# Patient Record
Sex: Male | Born: 1985 | Race: Black or African American | Hispanic: No | Smoking: Current every day smoker
Health system: Southern US, Community
[De-identification: ages and names within clinical notes are randomized; demographics above are authoritative.]

## PROBLEM LIST (undated history)

## (undated) DIAGNOSIS — R011 Cardiac murmur, unspecified: Secondary | ICD-10-CM

## (undated) DIAGNOSIS — T71162A Asphyxiation due to hanging, intentional self-harm, initial encounter: Secondary | ICD-10-CM

## (undated) DIAGNOSIS — T1491XA Suicide attempt, initial encounter: Secondary | ICD-10-CM

## (undated) DIAGNOSIS — Z8711 Personal history of peptic ulcer disease: Secondary | ICD-10-CM

## (undated) HISTORY — PX: ABDOMEN SURGERY: SHX537

## (undated) HISTORY — PX: ABDOMINAL SURGERY: SHX537

---
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
## 2012-05-24 ENCOUNTER — Emergency Department
Admission: EM | Admit: 2012-05-24 | Disposition: A | Discharge status: Home / Self Care | Payer: Self-pay | Source: Ambulatory Visit

## 2012-05-24 ENCOUNTER — Encounter: Payer: Self-pay | Admitting: Emergency Medicine

## 2012-05-24 DIAGNOSIS — K625 Hemorrhage of anus and rectum: Secondary | ICD-10-CM | POA: Diagnosis present

## 2012-05-24 HISTORY — DX: Personal history of peptic ulcer disease: Z87.11

## 2012-05-24 MED ORDER — OMEPRAZOLE 40 MG PO CPDR *I*
40.0000 mg | DELAYED_RELEASE_CAPSULE | Freq: Every day | ORAL | Status: AC
Start: 2012-05-24 — End: 2012-06-23

## 2012-05-24 NOTE — ED Notes (Signed)
Hx of ulcers. C/o black stools since yesterday and x1 emesis that was pink tinged

## 2012-05-24 NOTE — ED Notes (Cosign Needed)
Patient states he had blood in his stool three days ago.  No blood today.  He denies any abdominal pain, chest pain or any other problems.  He states his girlfriend talked to him yesterday and they decided to be seen here today.

## 2012-05-24 NOTE — Discharge Instructions (Signed)
You had a guaiac negative stool card in the ER.  You had no signs of extreme bleeding.    Omeprazole 40mg  daily.    Please get established with a Primary Care provider.  See list.    Please call the GI Clinic to have further testing likely a colonoscopy to determine why you are bleeding.    Return for worsening bleeding or other serious concerns.

## 2012-05-24 NOTE — ED Provider Notes (Signed)
History     Chief Complaint   Patient presents with   . Melena     HPI Comments: 26yo AAM presents with Rectal bleeding (BRBPR) several times this past week.  Pt states the blood was in the stool.  States the toilet water was not bloody.  Pt states he has a h/o gastric ulcers at age 26yo and took omeprazole for it.  Denies near syncope signs/symptoms or headaches.  Denies abd pain.      Past Medical History   Diagnosis Date   . History of stomach ulcers             History reviewed. No pertinent past surgical history.    Family History   Problem Relation Age of Onset   . Other Mother      Gastric ulcers.   . Other Paternal Grandmother      Paternal grandmother with gastric ulcers.         Social History      reports that he has been smoking Cigarettes.  He has a 2.5 pack-year smoking history. He has never used smokeless tobacco. He reports that he currently engages in sexual activity. He reports that he does not drink alcohol or use illicit drugs.    Living Situation    Questions Responses    Patient lives with Family    Homeless No    Caregiver for other family member No    External Services None    Employment Employed    Domestic Violence Risk           Review of Systems   Review of Systems   Constitutional: Negative for fever, chills, diaphoresis and fatigue.   HENT: Negative for sore throat.    Eyes: Negative for visual disturbance.   Respiratory: Negative for cough, choking, chest tightness, shortness of breath, wheezing and stridor.    Cardiovascular: Negative for chest pain, palpitations and leg swelling.   Gastrointestinal: Positive for blood in stool. Negative for nausea, vomiting and abdominal pain.   Genitourinary: Negative for dysuria, hematuria and difficulty urinating.   Skin: Negative for color change, pallor, rash and wound.   Neurological: Negative.    Hematological: Negative for adenopathy.   Psychiatric/Behavioral: Negative for behavioral problems, confusion and agitation.       Physical Exam      ED Triage Vitals   BP Heart Rate Heart Rate(via Pulse Ox) Resp Temp Temp src SpO2 O2 Device O2 Flow Rate   05/24/12 1726 05/24/12 1724 -- 05/24/12 1724 05/24/12 1724 -- 05/24/12 1724 05/24/12 1724 --   147/61 mmHg 80  18 36.9 C (98.4 F)  100 % None (Room air)       Weight           05/24/12 1724           81.647 kg (180 lb)               Physical Exam   Nursing note and vitals reviewed.  Constitutional: He is oriented to person, place, and time. He appears well-developed and well-nourished. No distress.   Neck: Normal range of motion. Neck supple. Normal carotid pulses and no JVD present. Carotid bruit is not present.   Cardiovascular: Normal rate, regular rhythm, normal heart sounds and intact distal pulses.  Exam reveals no gallop and no friction rub.    No murmur heard.  Pulmonary/Chest: Effort normal and breath sounds normal. No respiratory distress. He has no wheezes. He has no rales.  He exhibits no tenderness.   Abdominal: Soft. Bowel sounds are normal. He exhibits no distension and no mass. There is no tenderness. There is no rebound and no guarding.   Genitourinary: Rectum normal. Rectal exam shows no external hemorrhoid, no internal hemorrhoid, no fissure, no mass and no tenderness. Guaiac negative stool.   Musculoskeletal: Normal range of motion. He exhibits no edema and no tenderness.   Neurological: He is alert and oriented to person, place, and time. He has normal reflexes. No cranial nerve deficit. He exhibits normal muscle tone. Coordination normal.   Skin: Skin is warm and dry. No rash noted. He is not diaphoretic. No erythema. No pallor.   Psychiatric: He has a normal mood and affect. His behavior is normal. Judgment and thought content normal.       Medical Decision Making   <EDMDM>    Initial Evaluation:  ED First Provider Contact    Date/Time Event User Comments    05/24/12 1810 ED Provider First Contact Ronette Hank, Sonnie Alamo Initial Face to Face Provider Contact          Patient seen by me  today 05/24/2012 at 1810.    Assessment:  26 y.o., male comes to the ED with Rectal Bleeding without abd pain and without near syncope signs/symptoms.    Differential Diagnosis includes Gastric Ulcer vs Hemorrhoids vs Fissure.               Plan: Guaiac negative.  Omeprazole Rx given.  Advised pt to f/u with GI clinic and gave list of Pcps accepting new patients.  Reviewed reasons to return to ED.    Supervising physician Dr. Ruben Reason was immediately available.    Quentin Strebel Kathe Mariner, NP    Juanita Craver, NP  05/24/12 1905

## 2013-11-22 ENCOUNTER — Encounter (HOSPITAL_COMMUNITY): Payer: Self-pay | Admitting: Emergency Medicine

## 2013-11-22 ENCOUNTER — Emergency Department (HOSPITAL_COMMUNITY)
Admission: EM | Admit: 2013-11-22 | Discharge: 2013-11-22 | Disposition: A | Discharge status: Home / Self Care | Payer: Self-pay | Attending: Emergency Medicine | Admitting: Emergency Medicine

## 2013-11-22 DIAGNOSIS — K089 Disorder of teeth and supporting structures, unspecified: Secondary | ICD-10-CM | POA: Insufficient documentation

## 2013-11-22 DIAGNOSIS — K0889 Other specified disorders of teeth and supporting structures: Secondary | ICD-10-CM

## 2013-11-22 MED ORDER — NAPROXEN 500 MG PO TABS: 500.0000 mg | ORAL_TABLET | Freq: Two times a day (BID) | ORAL | Status: AC

## 2013-11-22 MED ORDER — HYDROCODONE-ACETAMINOPHEN 5-325 MG PO TABS: 2.0000 | ORAL_TABLET | ORAL | Status: AC | PRN

## 2013-11-22 MED ORDER — PENICILLIN V POTASSIUM 500 MG PO TABS: 500.0000 mg | ORAL_TABLET | Freq: Four times a day (QID) | ORAL | Status: AC

## 2013-11-22 MED ORDER — KETOROLAC TROMETHAMINE 60 MG/2ML IM SOLN
60.0000 mg | Freq: Once | INTRAMUSCULAR | Status: AC
  Administered 2013-11-22: 60 mg via INTRAMUSCULAR
  Filled 2013-11-22: qty 2

## 2013-11-22 MED ORDER — HYDROCODONE-ACETAMINOPHEN 5-325 MG PO TABS
2.0000 | ORAL_TABLET | Freq: Once | ORAL | Status: AC
  Administered 2013-11-22: 2 via ORAL
  Filled 2013-11-22: qty 2

## 2013-11-22 NOTE — ED Provider Notes (Signed)
CSN: [id number]     Arrival date & time 11/22/13  0303 History   First MD Initiated Contact with Patient 11/22/13 0321     Chief Complaint  Patient presents with  . Mouth Lesions     (Consider location/radiation/quality/duration/timing/severity/associated sxs/prior Treatment) HPI Comments: 28 year old male with a history of dental pain which started a couple of days ago, he has had increased pain in his mouth tonight over the right lower molar which she states has a hole in it. The patient also complains of left upper dental pain. He states that he wants his tooth pulled out. He denies any swelling of the jaw and has no difficulty opening, has no fevers chills nausea or vomiting. He has not seen a dentist in years  Patient is a 28 y.o. male presenting with mouth sores. The history is provided by the patient.  Mouth Lesions Associated symptoms: no ear pain and no fever     History reviewed. No pertinent past medical history. History reviewed. No pertinent past surgical history. No family history on file. History  Substance Use Topics  . Smoking status: Never Smoker   . Smokeless tobacco: Not on file  . Alcohol Use: No    Review of Systems  Constitutional: Negative for fever and chills.  HENT: Positive for dental problem. Negative for ear pain.       Allergies  Review of patient's allergies indicates no known allergies.  Home Medications   Current Outpatient Rx  Name  Route  Sig  Dispense  Refill  . HYDROcodone-acetaminophen (NORCO/VICODIN) 5-325 MG per tablet   Oral   Take 2 tablets by mouth every 4 (four) hours as needed.   10 tablet   0   . naproxen (NAPROSYN) 500 MG tablet   Oral   Take 1 tablet (500 mg total) by mouth 2 (two) times daily with a meal.   30 tablet   0   . penicillin v potassium (VEETID) 500 MG tablet   Oral   Take 1 tablet (500 mg total) by mouth 4 (four) times daily.   40 tablet   0    BP 152/75  Pulse 66  Temp(Src) 97.9 F (36.6 C)  (Oral)  Resp 17  SpO2 99% Physical Exam  Nursing note and vitals reviewed. Constitutional: He appears well-developed and well-nourished. No distress.  HENT:  Head: Normocephalic and atraumatic.  Mouth/Throat: Oropharynx is clear and moist. No oropharyngeal exudate.  Dental Disease - with deep carie found in the right lower molar and the left upper rear molar. No tenderness under the tongue, able to protrude the tongue without difficulty, no trismus or torticollis  Eyes: Conjunctivae are normal. No scleral icterus.  Neck: Normal range of motion. Neck supple. No thyromegaly present.  Cardiovascular: Normal rate and regular rhythm.   Pulmonary/Chest: Effort normal and breath sounds normal.  Lymphadenopathy:    He has no cervical adenopathy.  Neurological: He is alert.  Skin: Skin is warm and dry. No rash noted. He is not diaphoretic.    ED Course  Procedures (including critical care time) Labs Review Labs Reviewed - No data to display Imaging Review No results found.    MDM   Final diagnoses:  Toothache    No signs of Ludwig angina, the patient is otherwise appears stable, no asymmetry of the jaw, no fluctuance along the gumline  Impression followup with dentist, informed we would not pull his tooth which otherwise appears well   Meds given in ED:  Medications  ketorolac (TORADOL) injection 60 mg (not administered)    New Prescriptions   HYDROCODONE-ACETAMINOPHEN (NORCO/VICODIN) 5-325 MG PER TABLET    Take 2 tablets by mouth every 4 (four) hours as needed.   NAPROXEN (NAPROSYN) 500 MG TABLET    Take 1 tablet (500 mg total) by mouth 2 (two) times daily with a meal.   PENICILLIN V POTASSIUM (VEETID) 500 MG TABLET    Take 1 tablet (500 mg total) by mouth 4 (four) times daily.        Vida RollerBrian D Fotios Amos, MD 11/22/13 [phone]

## 2013-11-22 NOTE — ED Notes (Signed)
Patient presents with c/o right lower molar that is broken and the pain has been getting worse.

## 2013-11-22 NOTE — Discharge Instructions (Signed)
Please call your doctor for a followup appointment within 24-48 hours. When you talk to your doctor please let them know that you were seen in the emergency department and have them acquire all of your records so that they can discuss the findings with you and formulate a treatment plan to fully care for your new and ongoing problems.  Penicillin 4 times a day  Naprosyn twice a day, Vicodin for severe pain

## 2013-11-22 NOTE — ED Notes (Signed)
Discharge inst given  Voiced understanding 

## 2013-11-22 NOTE — ED Notes (Signed)
Patient also has broken molar on the R lower jaw. Pt report this is wear most of the pain is coming from.

## 2013-11-22 NOTE — ED Notes (Signed)
Per Patient: Patient reports generalized pain in his mouth since 1900 tonight. Patient has patchy white areas throughout mouth. Patient also reports R arm pain. Pt is alertx4, NAD. Airway intact, talking in complete sentences.

## 2016-09-18 ENCOUNTER — Emergency Department (HOSPITAL_COMMUNITY): Payer: Self-pay

## 2016-09-18 ENCOUNTER — Emergency Department (HOSPITAL_COMMUNITY)
Admission: EM | Admit: 2016-09-18 | Discharge: 2016-09-19 | Disposition: A | Discharge status: Other Acute Inpatient Hospital | Payer: Self-pay | Attending: Emergency Medicine | Admitting: Emergency Medicine

## 2016-09-18 ENCOUNTER — Encounter (HOSPITAL_COMMUNITY): Payer: Self-pay | Admitting: Vascular Surgery

## 2016-09-18 DIAGNOSIS — D72829 Elevated white blood cell count, unspecified: Secondary | ICD-10-CM | POA: Insufficient documentation

## 2016-09-18 DIAGNOSIS — Z79899 Other long term (current) drug therapy: Secondary | ICD-10-CM | POA: Insufficient documentation

## 2016-09-18 DIAGNOSIS — Y9389 Activity, other specified: Secondary | ICD-10-CM | POA: Insufficient documentation

## 2016-09-18 DIAGNOSIS — E872 Acidosis, unspecified: Secondary | ICD-10-CM

## 2016-09-18 DIAGNOSIS — Y929 Unspecified place or not applicable: Secondary | ICD-10-CM | POA: Insufficient documentation

## 2016-09-18 DIAGNOSIS — T71164A Asphyxiation due to hanging, undetermined, initial encounter: Secondary | ICD-10-CM

## 2016-09-18 DIAGNOSIS — F17290 Nicotine dependence, other tobacco product, uncomplicated: Secondary | ICD-10-CM | POA: Insufficient documentation

## 2016-09-18 DIAGNOSIS — Y999 Unspecified external cause status: Secondary | ICD-10-CM | POA: Insufficient documentation

## 2016-09-18 DIAGNOSIS — X838XXA Intentional self-harm by other specified means, initial encounter: Secondary | ICD-10-CM | POA: Insufficient documentation

## 2016-09-18 DIAGNOSIS — R930 Abnormal findings on diagnostic imaging of skull and head, not elsewhere classified: Secondary | ICD-10-CM | POA: Insufficient documentation

## 2016-09-18 DIAGNOSIS — N179 Acute kidney failure, unspecified: Secondary | ICD-10-CM | POA: Insufficient documentation

## 2016-09-18 DIAGNOSIS — T1491XA Suicide attempt, initial encounter: Secondary | ICD-10-CM | POA: Insufficient documentation

## 2016-09-18 HISTORY — DX: Cardiac murmur, unspecified: R01.1

## 2016-09-18 HISTORY — DX: Suicide attempt, initial encounter: T14.91XA

## 2016-09-18 HISTORY — DX: Asphyxiation due to hanging, intentional self-harm, initial encounter: T71.162A

## 2016-09-18 LAB — PREPARE FRESH FROZEN PLASMA
UNIT DIVISION: 0
Unit division: 0

## 2016-09-18 LAB — COMPREHENSIVE METABOLIC PANEL
ALK PHOS: 62 U/L (ref 38–126)
ALT: 40 U/L (ref 17–63)
ANION GAP: 11 (ref 5–15)
AST: 35 U/L (ref 15–41)
Albumin: 4.8 g/dL (ref 3.5–5.0)
BILIRUBIN TOTAL: 0.8 mg/dL (ref 0.3–1.2)
BUN: 8 mg/dL (ref 6–20)
CALCIUM: 9.4 mg/dL (ref 8.9–10.3)
CO2: 24 mmol/L (ref 22–32)
Chloride: 104 mmol/L (ref 101–111)
Creatinine, Ser: 1.24 mg/dL (ref 0.61–1.24)
GFR calc non Af Amer: >60 mL/min (ref >60)
Glucose, Bld: 93 mg/dL (ref 65–99)
POTASSIUM: 3.5 mmol/L (ref 3.5–5.1)
Sodium: 139 mmol/L (ref 135–145)
TOTAL PROTEIN: 8.6 g/dL — AB (ref 6.5–8.1)

## 2016-09-18 LAB — I-STAT CHEM 8, ED
BUN: 9 mg/dL (ref 6–20)
CHLORIDE: 103 mmol/L (ref 101–111)
Calcium, Ion: 1.04 mmol/L — ABNORMAL LOW (ref 1.15–1.40)
Creatinine, Ser: 1.4 mg/dL — ABNORMAL HIGH (ref 0.61–1.24)
GLUCOSE: 93 mg/dL (ref 65–99)
HCT: 50 % (ref 39.0–52.0)
Hemoglobin: 17 g/dL (ref 13.0–17.0)
Potassium: 3.8 mmol/L (ref 3.5–5.1)
SODIUM: 141 mmol/L (ref 135–145)
TCO2: 28 mmol/L (ref 0–100)

## 2016-09-18 LAB — CBC
HEMATOCRIT: 45.9 % (ref 39.0–52.0)
HEMOGLOBIN: 15.9 g/dL (ref 13.0–17.0)
MCH: 27.7 pg (ref 26.0–34.0)
MCHC: 34.6 g/dL (ref 30.0–36.0)
MCV: 80.1 fL (ref 78.0–100.0)
Platelets: 173 10*3/uL (ref 150–400)
RBC: 5.73 MIL/uL (ref 4.22–5.81)
RDW: 12.5 % (ref 11.5–15.5)
WBC: 11 10*3/uL — AB (ref 4.0–10.5)

## 2016-09-18 LAB — URINALYSIS, ROUTINE W REFLEX MICROSCOPIC
Bilirubin Urine: NEGATIVE
Glucose, UA: NEGATIVE mg/dL
HGB URINE DIPSTICK: NEGATIVE
Ketones, ur: NEGATIVE mg/dL
LEUKOCYTES UA: NEGATIVE
NITRITE: NEGATIVE
PROTEIN: NEGATIVE mg/dL
Specific Gravity, Urine: 1.041 — ABNORMAL HIGH (ref 1.005–1.030)
pH: 6 (ref 5.0–8.0)

## 2016-09-18 LAB — TYPE AND SCREEN
Blood Product Expiration Date: 201802042359
Blood Product Expiration Date: 201802062359
ISSUE DATE / TIME: 201801261624
ISSUE DATE / TIME: 201801261624
Unit Type and Rh: 9500
Unit Type and Rh: 9500

## 2016-09-18 LAB — ABO/RH: ABO/RH(D): B POS

## 2016-09-18 LAB — ETHANOL: Alcohol, Ethyl (B): 86 mg/dL — ABNORMAL HIGH (ref <5)

## 2016-09-18 LAB — SALICYLATE LEVEL: Salicylate Lvl: <7 mg/dL (ref 2.8–30.0)

## 2016-09-18 LAB — RAPID URINE DRUG SCREEN, HOSP PERFORMED
Amphetamines: NOT DETECTED
BARBITURATES: NOT DETECTED
Benzodiazepines: NOT DETECTED
COCAINE: NOT DETECTED
Opiates: NOT DETECTED
TETRAHYDROCANNABINOL: NOT DETECTED

## 2016-09-18 LAB — I-STAT CG4 LACTIC ACID, ED: LACTIC ACID, VENOUS: 2.75 mmol/L — AB (ref 0.5–1.9)

## 2016-09-18 LAB — ACETAMINOPHEN LEVEL: Acetaminophen (Tylenol), Serum: <10 ug/mL — ABNORMAL LOW (ref 10–30)

## 2016-09-18 LAB — PROTIME-INR
INR: 1.05
PROTHROMBIN TIME: 13.8 s (ref 11.4–15.2)

## 2016-09-18 MED ORDER — IOPAMIDOL (ISOVUE-370) INJECTION 76%: 50.0000 mL | Freq: Once | INTRAVENOUS | Status: DC | PRN

## 2016-09-18 MED ORDER — SODIUM CHLORIDE 0.9 % IV BOLUS (SEPSIS)
1000.0000 mL | Freq: Once | INTRAVENOUS | Status: AC
  Administered 2016-09-18: 1000 mL via INTRAVENOUS
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
## 2016-09-18 NOTE — ED Notes (Signed)
RN transported pt to CT 

## 2016-09-18 NOTE — ED Notes (Signed)
Pt transported to CT with RN

## 2016-09-18 NOTE — Progress Notes (Signed)
Orthopedic Tech Progress Note Patient Details:  Charles Joseph [birth year] [id number]  Patient ID: Charles Joseph, male   DOB: [birth year], 31 y.o.   MRN: [id number]   Nikki DomCrawford, Danielle Mink 09/18/2016, 4:41 PM Made lev el 1 trauma visit

## 2016-09-18 NOTE — BH Assessment (Signed)
Under Review: Brynn Mar, [address], [place], [address], [place], [place], [place]
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
## 2016-09-18 NOTE — ED Notes (Signed)
Doing TTS at this time.  Sitter at the bedside.

## 2016-09-19 ENCOUNTER — Encounter (HOSPITAL_COMMUNITY): Payer: Self-pay | Admitting: *Deleted

## 2016-09-19 LAB — BLOOD PRODUCT ORDER (VERBAL) VERIFICATION

## 2016-09-19 NOTE — ED Notes (Signed)
Father and aunts visiting w/pt from [place]. Drove down this am to see pt.

## 2016-09-19 NOTE — Progress Notes (Signed)
[facility] at [facility] inquired patient's social security number.  [name], LCSWA Disposition staff 09/19/2016 1:26 PM

## 2016-09-19 NOTE — ED Notes (Signed)
Attempted to call report x2 no answer 

## 2016-09-19 NOTE — Progress Notes (Signed)
[facility], to Dr. Jeannine KittenFarah, call report at [phone], pt can arrive after 2pm today. Page LCSW to contact ED RN to inquire about time of arrival per [facility] clinician's request.  [name], LCSWA Disposition staff 09/19/2016 1:15 PM

## 2016-09-19 NOTE — ED Notes (Signed)
IVC papers - Exam and Recommendation completed by Dr Rubin PayorPickering. [facility], copy sent to medical records, original placed in folder for magistrate and all 3 full sets placed on clipboard.

## 2016-09-19 NOTE — ED Notes (Signed)
Dr Rubin PayorPickering has IVC paperwork to complete 1st exam d/t papers taken out by [employer].

## 2016-09-19 NOTE — ED Provider Notes (Signed)
  Physical Exam  BP 134/83 (BP Location: Right Arm)   Pulse 79   Temp 99.2 F (37.3 C) (Oral)   Resp 18   Ht 6\' 1"  (1.854 m)   Wt 210 lb 8.6 oz (95.5 kg)   SpO2 99%   BMI 27.78 kg/m   Physical Exam  ED Course  Procedures  MDM Patient accepted [facility] by Dr. Jeannine KittenFarah.       Benjiman CoreNathan Jaline Pincock, MD 09/19/16 [phone]

## 2016-09-19 NOTE — ED Notes (Signed)
Patient was given a cup of ice water, and A regular diet was taken for Lunch.
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
## 2016-09-19 NOTE — ED Notes (Addendum)
[place]ia, [facility] speaking [facility] currently. Will request for them to call this RN for report d/t phones appear to be not working properly.

## 2016-09-19 NOTE — ED Notes (Addendum)
[facility]  - may arrive after 1400 -- in agreement w/tx plan. Pt calling his family and advising.

## 2016-09-19 NOTE — ED Notes (Signed)
Sitting on side of bed watching tv - waiting for shower to be cleaned.

## 2016-09-19 NOTE — ED Notes (Signed)
Cautia advised [facility] is registering pt's info and requests for RN to call to give report in 15 min. Advised Cautia this RN had requested for her to call for report d/t phones not working.

## 2016-09-19 NOTE — ED Notes (Addendum)
Attempt[facility] to [facility] - no answer at any of the [phone]). Spoke w/someone at [phone] - she advised unable to take report on pt until her assessment office advises them of acceptance of pt.

## 2016-09-19 NOTE — ED Notes (Signed)
Te-TTS being performed.

## 2016-09-19 NOTE — Progress Notes (Signed)
[facility] is under [facility] and writer will be contacted if there is a bed opening there, per Intake clinician Jasmin.  Charles Joseph, LCSWA Disposition staff 09/19/2016 12:28 PM

## 2016-09-20 ENCOUNTER — Encounter (HOSPITAL_COMMUNITY): Payer: Self-pay | Admitting: Emergency Medicine

## 2016-09-21 ENCOUNTER — Emergency Department
Admission: EM | Admit: 2016-09-21 | Disposition: A | Discharge status: Home / Self Care | Payer: Self-pay | Source: Ambulatory Visit | Attending: Psychiatry | Admitting: Psychiatry

## 2016-09-21 MED ORDER — NICOTINE POLACRILEX 2 MG MT GUM *I*
2.0000 mg | CHEWING_GUM | OROMUCOSAL | Status: DC | PRN
Start: 2016-09-21 — End: 2016-09-21
  Administered 2016-09-21: 2 mg via ORAL
  Filled 2016-09-21: qty 1

## 2016-09-21 MED ORDER — TRAZODONE HCL 50 MG PO TABS *I*
50.0000 mg | ORAL_TABLET | Freq: Once | ORAL | Status: AC
Start: 2016-09-21 — End: 2016-09-21
  Administered 2016-09-21: 50 mg via ORAL
  Filled 2016-09-21: qty 1

## 2016-09-21 NOTE — Discharge Instructions (Signed)
CPEP Discharge Instructions    Discharge Date: 09/21/2016    Discharge Time:   4:30pm    Follow-ups:  Appointment With: Partial Hospitalization Program Phone: [phone]  Date: Monday 09/28/16  Time: 9am  Location/Instructions: [address] [place], [place]     [facility] at [address] [place], [place] [id number] to apply for medical assistance.       Level of outreach indicated if patient fails new intake or COPS (Comprehensive Outpatient Psychiatric Service) appointment: Routine Program Follow-up    When to call for help:    Call your psychiatric outpatient provider if experiencing any of these symptoms: increased irritability, sleep changes, appetite changes, energy changes, thoughts to harm yourself or others, anxiety, fear, auditory or visual hallucinations.  [employer] Helpline (24 hours/7 days) [phone] Consulting civil engineer(TTY)  Mobile Crisis team: [phone]    General Instructions:  Other written information given to the patient: No            The above information has been discussed with me and I have received a copy.  I understand that I am advised to follow the instructions given to me to appropriately care for my condition.

## 2016-09-21 NOTE — Comprehensive Assessment (Signed)
09/21/16 1517   CPEP Summary of Services   Safety precautions implemented personal belongings secured;security metal scanning completed   Case consultation/discussion involving registered nurse;social worker;attending   Crisis intervention and safety planning involving verbal intervention;safety plan #1 developed (comment);access to firearms discussed and denied   Collateral information obtained from family member(s);other (comment)  (recent hospital records)   Referral offered, accepted and completed for  Partial Hospital Program services;No additional referrals needed   Referral(s) offered but declined by patient for No referrals declined

## 2016-09-21 NOTE — CPEP Notes (Signed)
CPEP Provider Initial Contact     CPEP initial provider evaluation performed by   CPEP Provider Initial Contact     Date/Time Event User Comments    09/21/16 0939 CPEP Provider Initial Contact Javonta Gronau N            Patient here for [place] alleged [place]. Denies SI now.     Vital signs reviewed.  Last Filed Vitals    09/21/16 0049   BP: (!) 150/91   Pulse: 77   Resp: 16   Temp: 36.3 C (97.3 F)   SpO2: 99%       Orders placed: other- collaterals    No other immediate concerns.  Patient requires further evaluation.    Corrie MckusickAURELIAN N Jilliann Subramanian, MD, 09/21/2016, 9:40 AM     Shaletta Hinostroza, Debbe OdeaAurelian N, MD  09/21/16 [phone]
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
## 2016-09-21 NOTE — ED Notes (Signed)
Safety Plan    Step 1: Warning signs (thoughts, images, mood, situation, behaviors) that a crisis may be developing:   1. My nose bleeds  2. I shake a lot (legs)  3. I talk from the past a lot  4. I get extremely quiet    Step 2: Internal Coping Strategies - Things I can do to take my mind off my problems without contacting another person   1.  jogging  2. cleaning  3. Moving things around to clean    Step 3: People and Social Settings that provide distraction   1. Name: mom    Phone:   2. Name:           Phone:   3. Place:      4. Place:     Step 4: People whom I can ask for help   1. Name: girlfriend   Phone:   2. Name: dad   Phone:   3. Name:    Phone:     Step 5: Professionals or agencies I can contact during a crisis   1. Clinician Name: Mobile Crisis Team   Phone #: [phone]  2. Local Urgent Care/Emergency Department: [facility]   3. Suicide Prevention Hotline Phone: 1-800-273-TALK (8255)  4. Mobile Crisis Team: [phone], [phone]    Step 6: Making the environment safe   1.   2.
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
## 2016-09-21 NOTE — CPEP Notes (Signed)
adult partial hospitalization program  [address] [place], [place] [id number]  Phone [phone] Fax [phone]    REFERRAL FORM  Form to be used by CPEP/EOB/MCT/CL only  Please print clearly or type    PATIENT NAME:[facility] MRN [id number]    Best phone number to reach patient: [phone]      INSURANCE:           Coverage:  Self Pay/Medicaidi Pending Contract #:       CLINICAL DATA:  (All boxes need to be checked)    X Yes Patient has a primary mental health diagnosis that reflects a need for PHP level of care  Excluded Diagnoses include Adjustment Disorder only if Medicare recipient, Primary Substance Use Disorder, Neurocognitive Disorder (dementia), or Primary Neurodevelopmental Disorder, IQ under 70    X Yes Patient is receptive to a group based treatment program    X Yes Patient is aware program requires daily participation from 9:30-3:15 and has no barriers to participation, such as: child care, transportation, housing, or other    Reason for PHP referral: Recent suicide attempt d/t increased psychosocial stress     Goal for PHP admission: facilitate increased coping skills    Alcohol Use:  ?Yes     X No Level and significance to treatment:           Drug Use:       ?Yes     X No Level and significance to treatment:           REFERRING PERSON:  Fredonia HighlandKristina Shy Guallpa, LCSW    Phone #: [phone]  X CPEP ? EOB ? MCT ? CL    ED Intake appointment slot the patient has been given  Date:Monday 09/28/16 Time: 9am  If patient needs an INTERPRETER, please DO NOT give an appointment from the ED slot schedule. Use the following process:   Submit this referral form to PHP   Indicate language needed: ? Spanish ? Sign  ? Other:         Inform the patient that the PHP intake coordinator will call them with an appointment as soon as an interpreter can be scheduled.   Provide patient with the PHP number to call if they have not been contacted in two business days.
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
## 2016-09-22 LAB — CDS SEROLOGY

## 2016-09-28 ENCOUNTER — Ambulatory Visit: Payer: Self-pay

## 2016-09-28 DIAGNOSIS — F4310 Post-traumatic stress disorder, unspecified: Secondary | ICD-10-CM
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
## 2017-10-19 LAB — UNMAPPED LAB RESULTS: HBV S Ab (HT): 39

## 2017-10-20 LAB — UNMAPPED LAB RESULTS: VZV IgG (HT): 5.2 AI

## 2018-02-02 ENCOUNTER — Emergency Department
Admission: EM | Admit: 2018-02-02 | Discharge: 2018-02-02 | Discharge status: Against Medical Advice | Payer: Self-pay | Source: Ambulatory Visit

## 2018-02-02 LAB — UNMAPPED LAB RESULTS
Basophil # (HT): 0.1 10 3/uL (ref 0.0–0.2)
Basophil % (HT): 1 % (ref 0–3)
Eosinophil # (HT): 0.3 10 3/uL (ref 0.0–0.6)
Eosinophil % (HT): 3 % (ref 0–5)
Hematocrit (HT): 46 % (ref 40–52)
Hemoglobin (HGB) (HT): 14.9 g/dL (ref 13.0–18.0)
Lymphocyte # (HT): 3.3 10 3/uL (ref 1.0–4.8)
Lymphocyte % (HT): 35 % (ref 15–45)
MCHC (HT): 32.6 g/dL (ref 32.0–37.5)
MCV (HT): 84 fL (ref 80–100)
Mean Corpuscular Hemoglobin (MCH) (HT): 27.3 pg (ref 26.0–34.0)
Monocyte # (HT): 1 10 3/uL (ref 0.1–1.0)
Monocyte % (HT): 11 % (ref 0–15)
Neutrophil # (HT): 4.8 10 3/uL (ref 1.8–8.0)
Platelets (HT): 164 10 3/uL (ref 150–450)
RBC (HT): 5.45 10 6/uL (ref 4.40–6.20)
RDW (HT): 12.7 % (ref 0.0–15.2)
Seg Neut % (HT): 51 % (ref 45–75)
WBC (HT): 9.5 10 3/uL (ref 4.0–11.0)

## 2018-02-02 NOTE — ED Notes (Signed)
Patient states he will be going to [facility], pt seen ambulating out of ED w/ friend. Left before triage complete.

## 2018-02-02 NOTE — ED Triage Notes (Signed)
Patient comes in endorsing blood tinged sputum while coughing x 2 weeks. States he began to feel faint and generalized weakness about 3 days. Denies sick contacts. States he doesn't currently have a cough, but is having continued blood tinged mucus. Also endorsing SOB while sleeping. Denies chest pain, n/v, dizziness. Not on blood thinner.       Triage Note   Alexander Skainsess Winferd Wease, RN

## 2018-05-16 ENCOUNTER — Telehealth: Payer: Self-pay

## 2018-05-16 NOTE — Telephone Encounter (Signed)
[facility] - Ambulatory Telephone Intake Screen     Patient Information:    Patient's Name: Marinell BlightJames Thien  Patient's Date of Birth: 1985/09/16  Patient's Medical Record Number: [id number]  Patient's Home Phone: [phone] (home)  Patient's Work Phone: There is no work phone number on file.  Patient's Mobile Phone:   No relevant phone numbers on file.     Patient's Marital Status (if applicable): Single  Can a message be left? yes      Insurance Information:    Had work Educational psychologistinsurance  Policy number: [id number]      Referral Source:  Referral Source: self      Presenting Concern:     What is the reason you are seeking care?     (Please provide a brief description of the reasons the patient or referring provider is seeking mental health treatment at this time.  Please use patients own words where possible).    I have real bad anxiety, depression. Mood swings.                                     Mental Health History:    Are you currently involved in mental health treatment?  No       Are you currently taking a long acting injectable medication?  No    Will you need an injection at the time of your first intake assessment appointment?   no      Customer Service:    Do you need arrangements for?    Wheelchair: No    Interpreter Services:No

## 2018-05-17 IMAGING — CT CT HEAD W/O CM
5 of 8 series · 16 of 47 positions shown, 17 images · non-contrast
Comparison: None.

CLINICAL DATA: 31-year-old male with possible is fixation/hanging

EXAM:
CT HEAD WITHOUT CONTRAST
CT CERVICAL SPINE WITHOUT CONTRAST
TECHNIQUE: Multidetector CT imaging of the head and cervical spine was
performed following the standard protocol without intravenous
contrast. Multiplanar CT image reconstructions of the cervical spine
were also generated.

[Series 3: head without · axial · non-contrast · 0.49mm/px · z∈[-79,+86]mm · 3 of 34 slices shown, 4 images]
[im 1/34  brain]
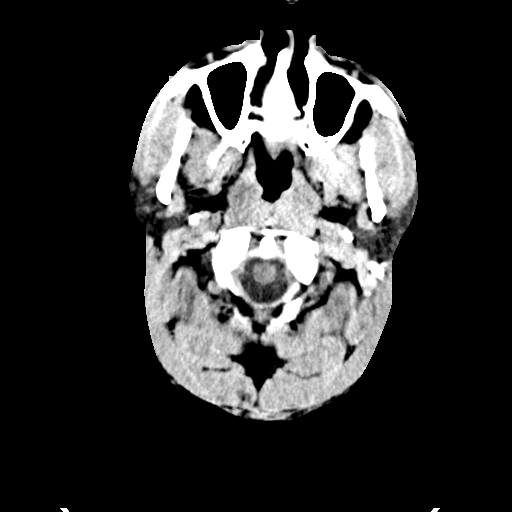
[im 1/34  bone]
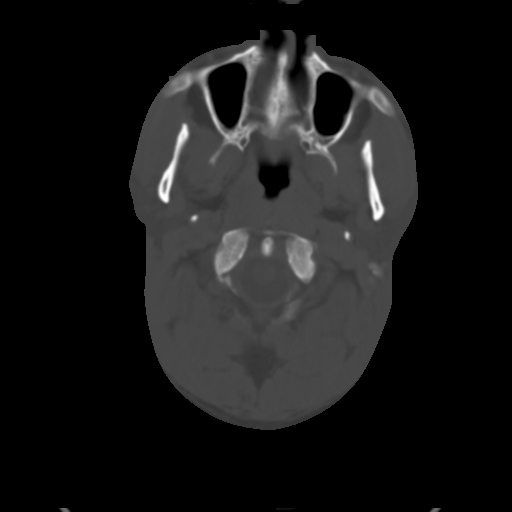
[im 17/34  brain]
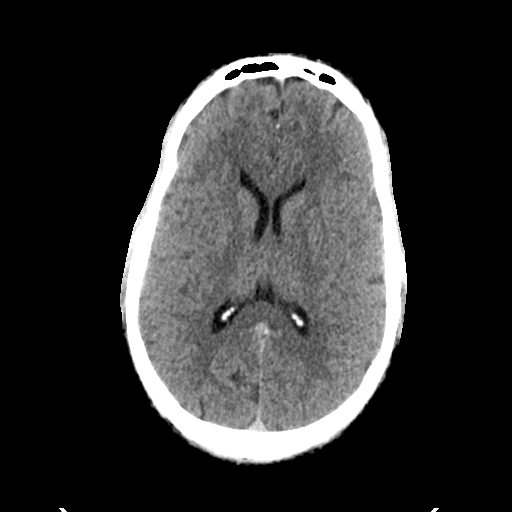
[im 34/34  brain]
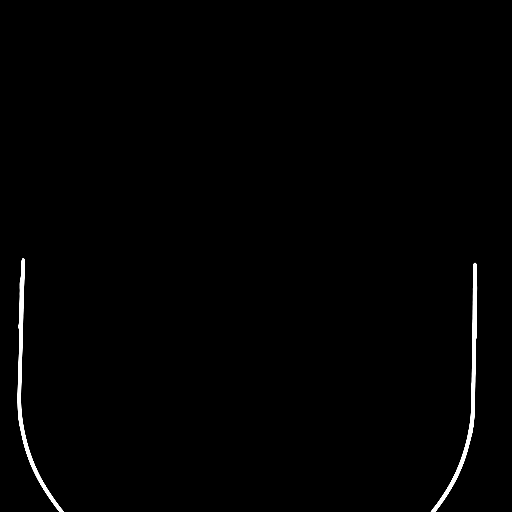

[Series 4: head bone · axial · 0.49mm/px · z∈[-55,+65]mm · 6 of 85 slices shown]
[im 13/85  bone]
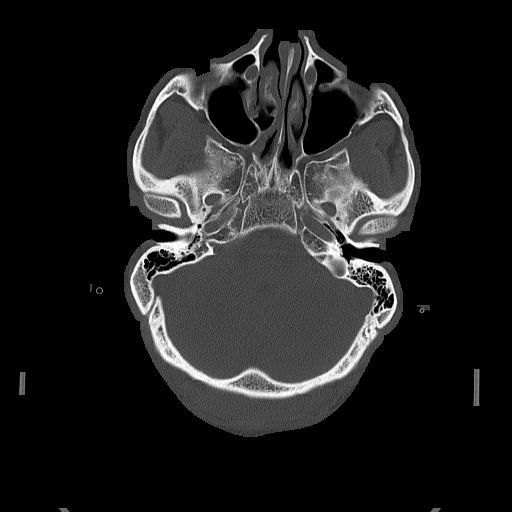
[im 25/85  bone]
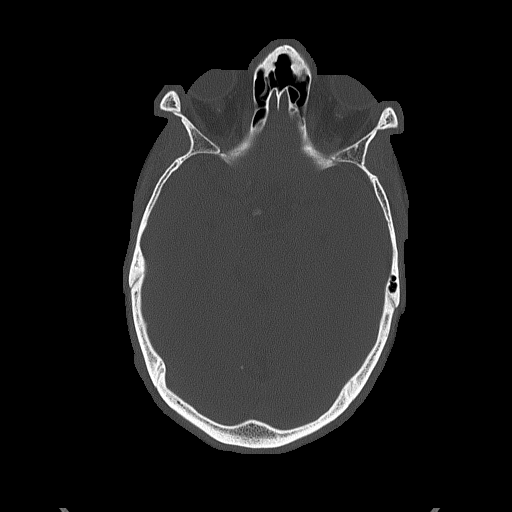
[im 37/85  bone]
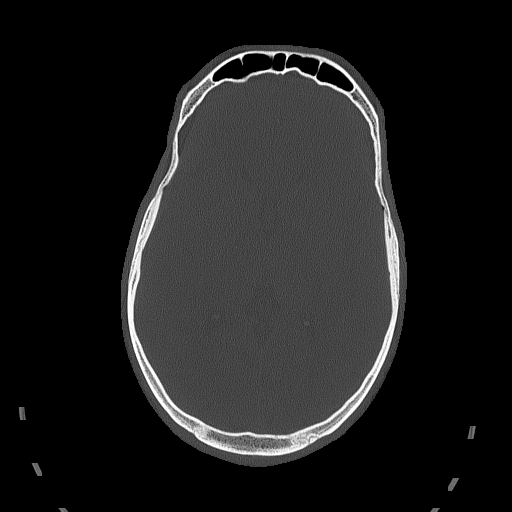
[im 49/85  bone]
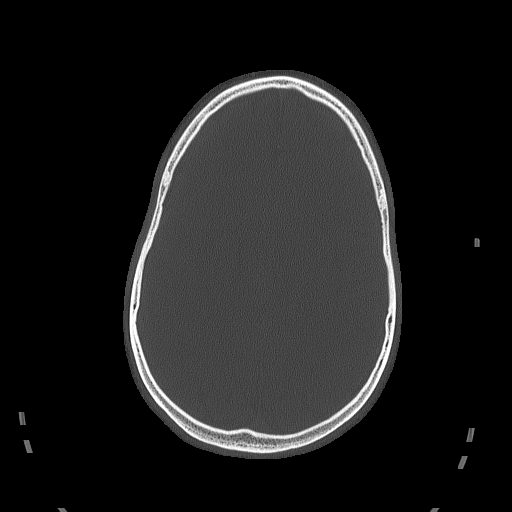
[im 61/85  bone]
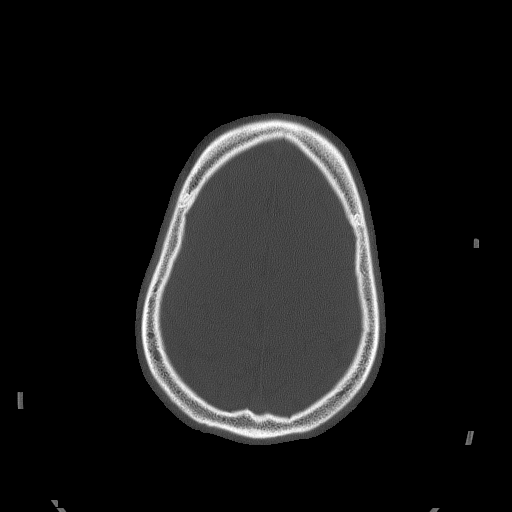
[im 73/85  bone]
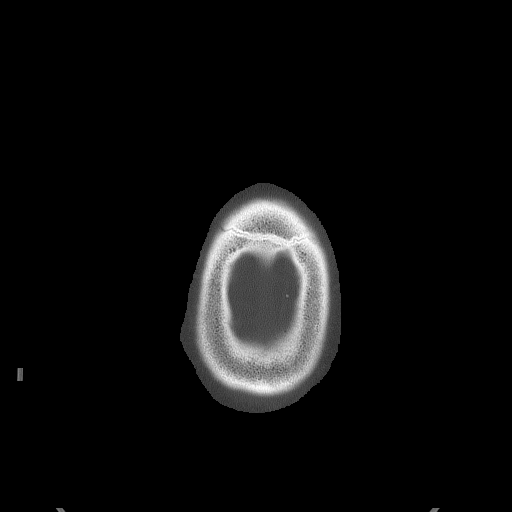

[Series 6: head without sag · sagittal · non-contrast · 0.32mm/px · 1 of 58 slices shown]
[im 29/58  brain]
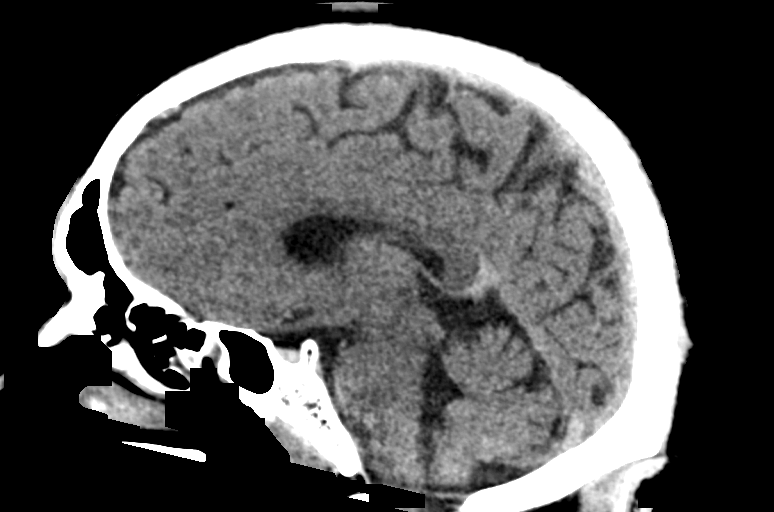

[Series 10: c_spine 2.0 cor bone · coronal · 0.30mm/px · 3 of 92 slices shown]
[im 35/92  brain]
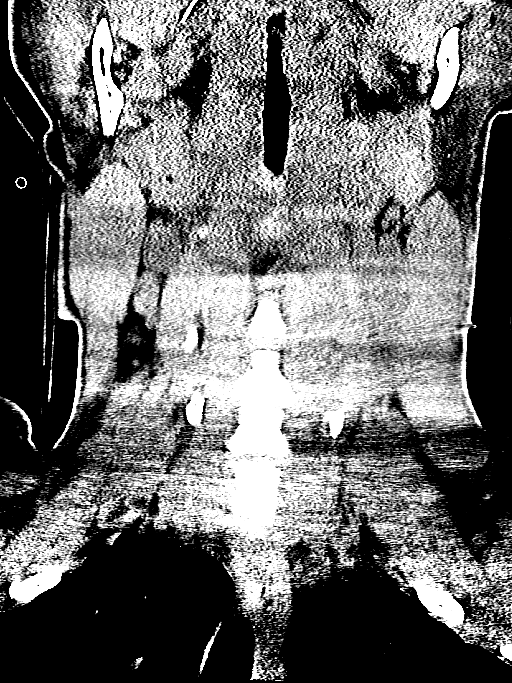
[im 46/92  brain]
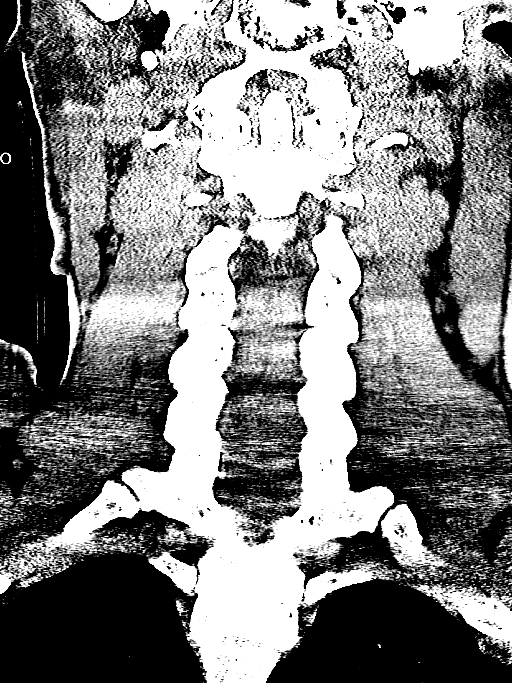
[im 57/92  brain]
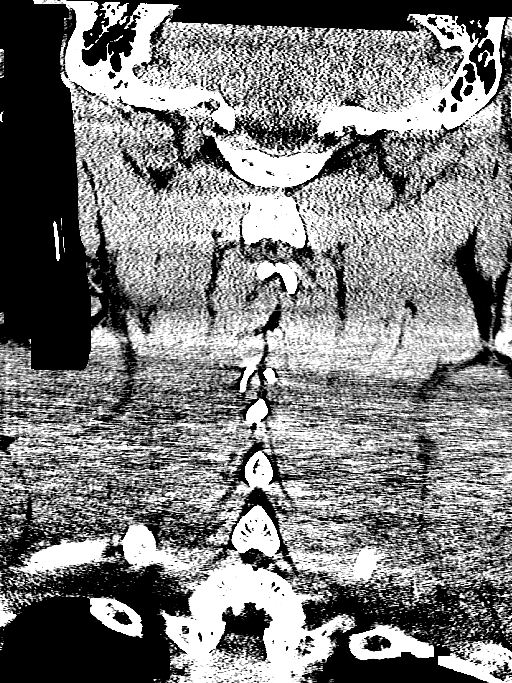

[Series 11: c_spine 2.0 orthogonals · axial · 0.21mm/px · z∈[-274,-234]mm · 3 of 103 slices shown]
[im 12/103  brain]
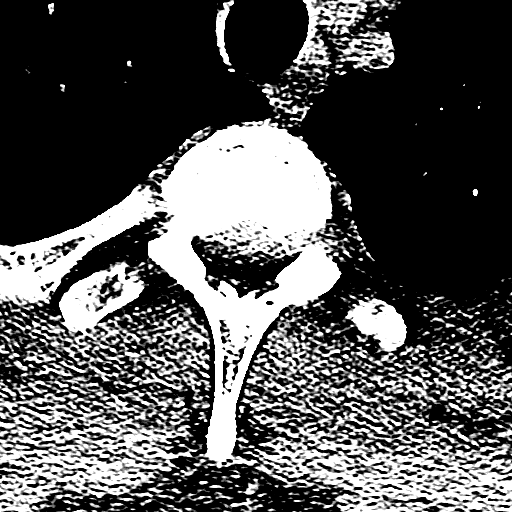
[im 23/103  brain]
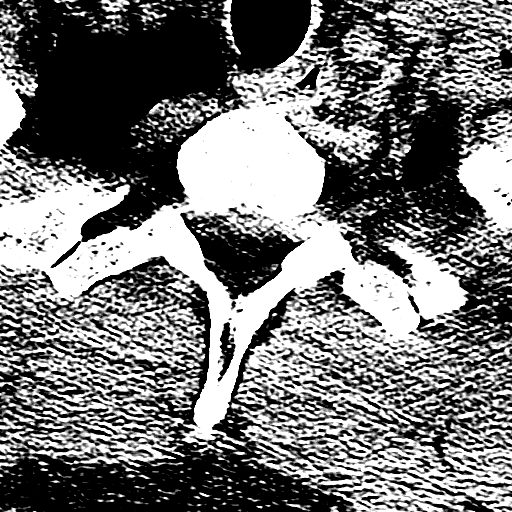
[im 35/103  brain]
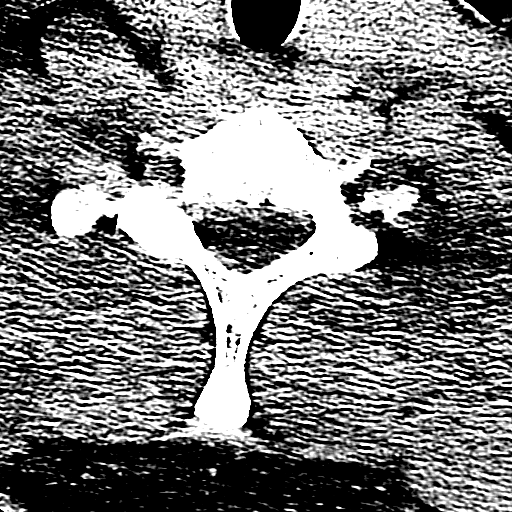

[16 of 47 positions shown; findings below may reference images not displayed]

FINDINGS: CT HEAD FINDINGS

Brain: No acute intracranial hemorrhage. No midline shift or mass
effect. Gray-white differentiation maintained. Unremarkable
appearance of the ventricular system.

Vascular: Unremarkable.

Skull: No acute fracture.  No aggressive bone lesion identified.

Sinuses/Orbits: Unremarkable appearance of the orbits. Mastoid air
cells clear. No middle ear effusion. No significant sinus disease.

Other: None

CT CERVICAL SPINE FINDINGS

Alignment: Craniocervical junction aligned. Anatomic alignment of
the cervical elements. No subluxation.

Skull base and vertebrae: No acute fracture at the skullbase.
Vertebral body heights relatively maintained. No acute fracture
identified.

Soft tissues and spinal canal: Unremarkable cervical soft tissues.
Lymph nodes are present, though not enlarged.

Disc levels: Unremarkable appearance of disc space, which are
maintained.

Upper chest: Unremarkable appearance of the lung apices.

Other: No bony canal narrowing.  Bilateral dental caries
IMPRESSION: Head CT:

No CT evidence of acute intracranial abnormality.

Cervical CT:

No CT evidence of acute fracture or malalignment of the cervical
cervical spine.

Multiple dental caries.

## 2018-06-14 ENCOUNTER — Ambulatory Visit: Payer: Self-pay

## 2018-06-14 ENCOUNTER — Telehealth: Payer: Self-pay

## 2018-06-14 NOTE — Progress Notes (Signed)
Clinical Management Note     Pt contacted the call center because he missed his appointment with writer stating that he's having problems with his insurance coverage. The call center provided pt with numbers for community providers. He chose not to reschedule at this time.

## 2018-06-14 NOTE — Telephone Encounter (Signed)
Pt called because he missed appt with Willis Modena today. He wanted to reschedule intake appt. Writer told available appts were on Mid December. Pt had some issues with his insurance. Writer gave Pt several phone numbers for other clinics in the area.

## 2019-04-07 ENCOUNTER — Other Ambulatory Visit: Payer: Self-pay

## 2019-07-17 ENCOUNTER — Ambulatory Visit
Admission: AD | Admit: 2019-07-17 | Discharge: 2019-07-17 | Disposition: A | Discharge status: Home / Self Care | Payer: Self-pay | Source: Ambulatory Visit | Attending: Emergency Medicine | Admitting: Emergency Medicine

## 2019-07-17 DIAGNOSIS — Z20828 Contact with and (suspected) exposure to other viral communicable diseases: Secondary | ICD-10-CM | POA: Insufficient documentation

## 2019-07-17 NOTE — ED Triage Notes (Signed)
Patient presenting to Urgent Care for testing only. COVID-19 test ordered by Urgent Care Provider     Patient occupation: None of the above    Does this patient currently have symptoms concerning for COVID-19?: No     What is the reason for testing?: Routine ambulatory possible exposure       Nasal swab obtained and sent for analysis.         Triage Note   Leyah Bocchino A Shamone Winzer, RN

## 2019-07-19 LAB — COVID-19 PCR

## 2019-07-19 LAB — COVID-19 NAAT (PCR): COVID-19 NAAT (PCR): NEGATIVE

## 2021-10-10 LAB — UNMAPPED LAB RESULTS: Rubella IgG AB (HT): 26.9 (ref >9.9)

## 2021-10-13 LAB — UNMAPPED LAB RESULTS
HBV S Ab (HT): 37
Rubella IgG AB (HT): 29.4 (ref >9.9)

## 2023-02-26 ENCOUNTER — Other Ambulatory Visit: Payer: Self-pay

## 2023-02-26 MED ORDER — IBUPROFEN 400 MG PO TABS *I*
400.0000 mg | ORAL_TABLET | Freq: Four times a day (QID) | ORAL | 0 refills | Status: AC | PRN
Start: 2023-02-26 — End: ?
  Filled 2023-02-26: qty 24, 6d supply, fill #0

## 2023-02-26 MED ORDER — AMOXICILLIN 500 MG PO CAPS *I*
500.0000 mg | ORAL_CAPSULE | Freq: Three times a day (TID) | ORAL | 0 refills | Status: AC
Start: 2023-02-26 — End: 2023-03-05
  Filled 2023-02-26: qty 21, 7d supply, fill #0

## 2023-02-26 MED ORDER — ACETAMINOPHEN 325 MG PO TABS *I*
325.0000 mg | ORAL_TABLET | Freq: Four times a day (QID) | ORAL | 0 refills | Status: AC | PRN
Start: 2023-02-26 — End: ?
  Filled 2023-02-26: qty 24, 6d supply, fill #0

## 2023-07-28 ENCOUNTER — Emergency Department
Admission: EM | Admit: 2023-07-28 | Discharge: 2023-07-28 | Disposition: A | Discharge status: Home / Self Care | Payer: BLUE CROSS/BLUE SHIELD | Source: Ambulatory Visit | Attending: Emergency Medicine | Admitting: Emergency Medicine

## 2023-07-28 ENCOUNTER — Other Ambulatory Visit: Payer: Self-pay

## 2023-07-28 DIAGNOSIS — T7801XA Anaphylactic reaction due to peanuts, initial encounter: Secondary | ICD-10-CM

## 2023-07-28 DIAGNOSIS — Y9289 Other specified places as the place of occurrence of the external cause: Secondary | ICD-10-CM | POA: Insufficient documentation

## 2023-07-28 DIAGNOSIS — T7840XA Allergy, unspecified, initial encounter: Secondary | ICD-10-CM | POA: Insufficient documentation

## 2023-07-28 DIAGNOSIS — X58XXXA Exposure to other specified factors, initial encounter: Secondary | ICD-10-CM | POA: Insufficient documentation

## 2023-07-28 DIAGNOSIS — F17210 Nicotine dependence, cigarettes, uncomplicated: Secondary | ICD-10-CM | POA: Insufficient documentation

## 2023-07-28 DIAGNOSIS — Y9389 Activity, other specified: Secondary | ICD-10-CM | POA: Insufficient documentation

## 2023-07-28 DIAGNOSIS — Y998 Other external cause status: Secondary | ICD-10-CM | POA: Insufficient documentation

## 2023-07-28 MED ORDER — EPINEPHRINE 0.3 MG/0.3ML IJ SOAJ *I*
0.3000 mg | INTRAMUSCULAR | 0 refills | Status: AC | PRN
Start: 2023-07-28 — End: ?
  Filled 2023-07-28: qty 2, 1d supply, fill #0
  Filled 2023-07-28: qty 2, 30d supply, fill #0
  Filled 2023-08-05: qty 2, 2d supply, fill #0
  Filled 2023-08-19: qty 2, 30d supply, fill #0

## 2023-07-28 NOTE — ED Provider Notes (Addendum)
History     Chief Complaint   Patient presents with    Allergic Reaction     This is a 37 year old male with a past medical history significant for stomach ulcers and known allergy to peanuts who presents to the emergency department for evaluation of possible allergic reaction.  He was at work this morning and around 6:15 AM had a brushed up against peanuts from a granola bar.  He states afterwards his hand started to itch and became swollen.  He did feel some scratching in his throat and endorsed some mild shortness of breath.  He took Benadryl and then called EMS.  He states his symptoms have since resolved.  He is no longer having any feelings of scratching or discomfort in his throat.  He wrapped his hand in an alcohol dressing and reports significant improvement there as well.  He denies any systemic rash.  He denies any GI involvement including nausea or abdominal pain.          Medical/Surgical/Family History     Past Medical History:   Diagnosis Date    History of stomach ulcers         Patient Active Problem List   Diagnosis Code    Rectal bleeding K62.5            Past Surgical History:   Procedure Laterality Date    ABDOMEN SURGERY            Social History     Tobacco Use    Smoking status: Every Day     Packs/day: 0.50     Years: 5.00     Additional pack years: 0.00     Total pack years: 2.50     Types: Cigarettes    Smokeless tobacco: Never   Substance Use Topics    Alcohol use: Yes     Comment: socially, maybe twice in a month    Drug use: No             Review of Systems    Physical Exam     Triage Vitals  Triage Start: Start, (07/28/23 0725)  First Recorded BP: (!) 150/99, Resp: 18, Temp: 36.3 C (97.3 F) Oxygen Therapy SpO2: 99 %, O2 Device: None (Room air), Heart Rate: 70, (07/28/23 0726)  .  First Pain Reported  0-10 Scale: 0, (07/28/23 7829)       Physical Exam  Vitals reviewed.   Constitutional:       Appearance: Normal appearance.   HENT:      Head: Normocephalic.      Right Ear: External  ear normal.      Left Ear: External ear normal.      Nose: Nose normal.      Mouth/Throat:      Mouth: Mucous membranes are moist.      Pharynx: Oropharynx is clear.      Comments: I see no swelling on examination of his posterior oropharynx he is phonating well  Eyes:      General: No scleral icterus.     Conjunctiva/sclera: Conjunctivae normal.      Pupils: Pupils are equal, round, and reactive to light.   Cardiovascular:      Rate and Rhythm: Normal rate and regular rhythm.   Pulmonary:      Effort: Pulmonary effort is normal. No respiratory distress.   Abdominal:      General: Abdomen is flat.      Palpations: Abdomen is soft.  Tenderness: There is no abdominal tenderness. There is no guarding or rebound.   Musculoskeletal:      Cervical back: Normal range of motion.      Right lower leg: No edema.      Left lower leg: No edema.   Skin:     General: Skin is warm and dry.      Coloration: Skin is not jaundiced.   Neurological:      Mental Status: He is alert and oriented to person, place, and time. Mental status is at baseline.   Psychiatric:         Mood and Affect: Mood normal.         Behavior: Behavior normal.         Medical Decision Making   Patient seen by me on:  07/28/2023    Assessment:  This is a 37 year old male with a known history of allergic reactions to peanuts.  He was exposed to peanuts via granola bar that one of his coworkers was eating.  He endorsed symptoms including hand swelling and itching along with discomfort in his eyes and in his throat.  He did have some mild shortness of breath.  He is already taken 2 tablets of Benadryl which she reports have resolved his symptoms.  He came via EMS with stable vital signs.  EMS did not provide any additional medications.  He states that his symptoms have since resolved and he is feeling well.  Denied any GI involvement.  His history of simply brushing up against peanuts is concerning for the development of significant allergies.  He did have a  skin test when he was younger in his teenage years and reports that he was only mildly allergic at that time.  However given the fact that his symptoms progressed at this point after simply tactile exposure he likely needs to be prescribed an EpiPen at this point.  With his symptoms improved I will monitor him for period of time and if he remains asymptomatic he will be discharged.  I will give him an EpiPen prescription.    Differential diagnosis:  Allergic reaction, anaphylaxis, dermatitis    Plan:  Observation    ED Course and Disposition:  Throughout her stay in the emerged department his vitals remained stable.  He had no recurrence of his symptoms.  He likely had an allergic reaction secondary to tactile exposure to his peanuts.  There is concern given how this was only a tactile exposure he developed the symptoms.  Fortunately he took Benadryl which seems to have stopped the progression.  Epinephrine is not indicated at this time given complete resolution of his symptoms.  I have however prescribed him 2 epinephrine pens for him to keep on him 1 at home and 1 on him at all times.  I will also give him very referral to allergy for reevaluation and seeing how it has been over 2 decades since his last skin testing.  It is possible that his allergy has progressed during this time.  He should avoid contact with peanuts or peanut containing food for the foreseeable future until cleared by allergy.  I give him instructions on how to use an epinephrine pen.  Strict return precautions were also given to him.  He is stable for discharge at this time.         Kermit Balo. Rayna Sexton, DO [facility]  Emergency Medicine Resident, PGY-1   Gaylan Gerold, DO      Resident Attestation:  Patient seen by me on 07/28/2023.  I saw and evaluated the patient. I agree with the resident's/fellow's findings and plan of care as documented above.    Author:  Elicia Lamp, MD         Gaylan Gerold, DO  Resident  07/28/23 1610       Elicia Lamp,  MD  07/28/23 [phone]

## 2023-07-28 NOTE — Discharge Instructions (Signed)
You were seen and evaluated today in the emergency department following an allergic reaction.  Should you have recurrence of your symptoms and they are mild you can take an antihistamine such as Allegra, Xyzal, Claritin, Zyrtec or Benadryl.  Please be aware that you only need to take 1 of these medications and do not need to take all of them.      Signs and symptoms consistent with anaphylaxis include severe abdominal pain, nausea and vomiting, severe shortness of breath itchy skin or development of hives    Anaphylaxis can still occur so is important to be mindful of the above symptoms.  Should those symptoms begin to reappear please call 911 immediately to have EMS evaluate you.  EMS carries epinephrine and will be able to give it to you should they deem it necessary.  Please avoid any known allergen that you may have.    I have prescribed an EpiPen to your pharmacy of choice.  Please pick up the EpiPen and carry it on you at all times.  This includes when you leave your home as you never know when you will be exposed to the allergen again.    Please follow-up with your primary care provider in the next week.    Please return to the emergency department should you have a recurrence of the symptoms stated above or any worsening concerns.

## 2023-07-28 NOTE — ED Notes (Signed)
Pt discharged per provider. Discharge instructions reviewed, pt verbalized understanding. No IV placed on admission. VSS. A&Ox4. Pt left ED ambulatory independently with all personal belongings and access to residence. Pts mom here to pick him up.

## 2023-07-28 NOTE — ED Triage Notes (Signed)
Was at work, touched something that had peanuts on it. Initially was SOB with a scratchy throat. Hand was swollen. Everything is resolved now. Pt has no complaints.     Prehospital medications given: No

## 2023-08-04 ENCOUNTER — Other Ambulatory Visit: Payer: Self-pay

## 2023-08-05 ENCOUNTER — Other Ambulatory Visit: Payer: Self-pay

## 2023-08-05 NOTE — Progress Notes (Signed)
[facility] SOCIAL WORK  PHARMACY FORM     Today's date:  August 05, 2023    Patient Name: Alexander Todd      Medical Record #: [id number]   DOB: 05-Aug-1986  Patient's Address: [address] [place]              Social Worker: Celesta Aver, LMSW       Date of Service: August 05, 2023       Funding Source: [place] Med[employer]  ___________________________________________________________________    Pharmacy Information:  Date/time sent: August 05, 2023     Time needed: ASAP    Patient Location: ED    Medication Pick-up Preference: Patient will pick up at the pharmacy    Pharmacy Contact: Domingo Cocking  Social work signature: Celesta Aver, LMSW  Supervisor/Manager Approval (if indicated):  Date:  (supervisor signature not required for Medicaid pending)    Pt insurance needs prior auth.     Celesta Aver, LMSW  ED Social Worker  [phone] or [phone]

## 2023-08-06 ENCOUNTER — Ambulatory Visit: Payer: BLUE CROSS/BLUE SHIELD | Admitting: Internal Medicine

## 2023-08-06 NOTE — Progress Notes (Deleted)
 Allergy/Immunology Adult New Patient Visit Note      PRIMARY CARE PHYSICIAN: Unknown, Provider, MD    Allergy/Immunology Snapshot: No specialty comments available.    Subjective   HPI:   Alexander Todd is a 37 y.o. year old male who is here for follow up of ***    Update the Asthma Control Test       {TIP  Include pertinent history items in your HPI. Use the links below to update other history sections in the chart.  Problem List  Past Medical History  Medications  Allergies  Family History  Tobacco/Substance History  Social History  Review all  You can also check to see if specialty-specific documentation is up to date  Review diagnosis-specific information  :28549}         Current Meds:  Current Outpatient Medications   Medication Sig   . EPINEPHrine (EPIPEN) 0.3 mg/0.3 mL auto-injector Inject 0.3 mLs (0.3 mg total) into the muscle as needed for Anaphylaxis.   Marland Kitchen ibuprofen (IBU) 400 mg tablet Take 1 tablet by mouth every six hours as needed for pain   . acetaminophen (TYLENOL) 325 mg tablet Take 1 tablet by mouth every six hours as needed for pain            Objective    PHYSICAL EXAMINATION:  There were no vitals taken for this visit.  There is no height or weight on file to calculate BMI.  There were no vitals filed for this visit.   Physical Exam    PRIOR STUDIES:    Asthma Control Test:       Allergy Testing:                   Immunotherapy hx:       Labs:   {Review Labs, Review Media :28549}  {LABS ZOX:09604::"- Recent labs reviewed -- please see ERecord results review section for values."}    Radiology:     {Review Radiology :28549}  {RADIOLOGY RESULTS RHEUM:32719::"-Recent imaging reports reviewed -- please see ERecord imaging section for details."}        Assessment    IMPRESSION:  {Update visit diagnosis :28549}  No diagnosis found.    ***    RECOMMENDATIONS:   {Update patient instructions :28549} There are no Patient Instructions on file for this visit.

## 2023-08-15 ENCOUNTER — Other Ambulatory Visit: Payer: Self-pay

## 2023-08-19 ENCOUNTER — Other Ambulatory Visit: Payer: Self-pay

## 2023-08-19 NOTE — Progress Notes (Signed)
[facility] SOCIAL WORK  PHARMACY FORM     Today's date:  August 19, 2023    Patient Name: Alexander Todd      Medical Record #: [id number]   DOB: May 23, 1986  Patient's Address: [address] [place]              Social Worker: Celesta Aver, LMSW       Date of Service: August 19, 2023       Funding Source: [employer] Med[employer]  ___________________________________________________________________    Pharmacy Information:  Date/time sent: August 19, 2023     Time needed: ASAP    Patient Location: ED    Medication Pick-up Preference: Patient will pick up at the pharmacy    Pharmacy Contact: Allyson Sabal  Social work signature: Celesta Aver, LMSW  Supervisor/Manager Approval (if indicated):  Date:  (supervisor signature not required for Medicaid pending)    Pt does not have insurance. Writer placed [facility] referral.     Celesta Aver, LMSW  ED Social Worker  [phone] or [phone]

## 2023-08-27 ENCOUNTER — Other Ambulatory Visit: Payer: Self-pay

## 2024-03-02 ENCOUNTER — Other Ambulatory Visit: Payer: Self-pay

## 2024-03-02 MED ORDER — IBUPROFEN 400 MG PO TABS *I*
400.0000 mg | ORAL_TABLET | Freq: Four times a day (QID) | ORAL | 0 refills | Status: AC | PRN
Start: 2024-03-02 — End: ?
  Filled 2024-03-02 (×2): qty 28, 7d supply, fill #0

## 2024-03-12 ENCOUNTER — Other Ambulatory Visit: Payer: Self-pay

## 2024-06-02 ENCOUNTER — Encounter: Payer: Self-pay | Admitting: Internal Medicine

## 8387-04-25 DEATH — deceased
